# Patient Record
Sex: Female | Born: 1960 | Race: White | Hispanic: No | Marital: Married | Smoking: Current every day smoker
Health system: Southern US, Community
[De-identification: ages and names within clinical notes are randomized; demographics above are authoritative.]

## PROBLEM LIST (undated history)

## (undated) DIAGNOSIS — I10 Essential (primary) hypertension: Secondary | ICD-10-CM

## (undated) HISTORY — PX: MASTECTOMY: SHX3

---

## 2006-04-03 ENCOUNTER — Other Ambulatory Visit: Admission: RE | Admit: 2006-04-03 | Discharge: 2006-04-03 | Payer: Self-pay | Admitting: Family Medicine

## 2006-04-16 ENCOUNTER — Ambulatory Visit: Payer: Self-pay

## 2006-04-18 ENCOUNTER — Emergency Department: Payer: Self-pay | Admitting: Internal Medicine

## 2006-05-12 ENCOUNTER — Ambulatory Visit: Payer: Self-pay

## 2006-10-28 ENCOUNTER — Ambulatory Visit: Payer: Self-pay | Admitting: General Surgery

## 2007-09-15 ENCOUNTER — Ambulatory Visit (HOSPITAL_COMMUNITY): Admission: RE | Admit: 2007-09-15 | Discharge: 2007-09-15 | Payer: Self-pay | Admitting: Family Medicine

## 2007-11-19 ENCOUNTER — Ambulatory Visit (HOSPITAL_COMMUNITY): Admission: RE | Admit: 2007-11-19 | Discharge: 2007-11-20 | Payer: Self-pay | Admitting: General Surgery

## 2007-11-19 ENCOUNTER — Encounter (INDEPENDENT_AMBULATORY_CARE_PROVIDER_SITE_OTHER): Payer: Self-pay | Admitting: General Surgery

## 2007-11-21 ENCOUNTER — Emergency Department (HOSPITAL_COMMUNITY): Admission: EM | Admit: 2007-11-21 | Discharge: 2007-11-21 | Payer: Self-pay | Admitting: Emergency Medicine

## 2008-05-17 ENCOUNTER — Encounter: Admission: RE | Admit: 2008-05-17 | Discharge: 2008-05-17 | Payer: Self-pay | Admitting: Obstetrics and Gynecology

## 2008-05-25 IMAGING — CR DG TOE 5TH LEFT
1 series · 3 of 3 positions shown · non-contrast
Comparison: none

REASON FOR EXAM: Pnjury---LWT
COMMENTS:

PROCEDURE:     DXR - DXR TOE 5TH DIGIT LEFT FOOT  - April 18, 2006 [DATE]
RESULT:     Transverse fracture is appreciated along the base of the shaft
of the proximal phalanx fifth toe.  The distal fracture fragment
demonstrates lateral angulation.

[Series 1: view not recorded · 0.17mm/px · 3 of 3 slices shown]
[im 1/3]
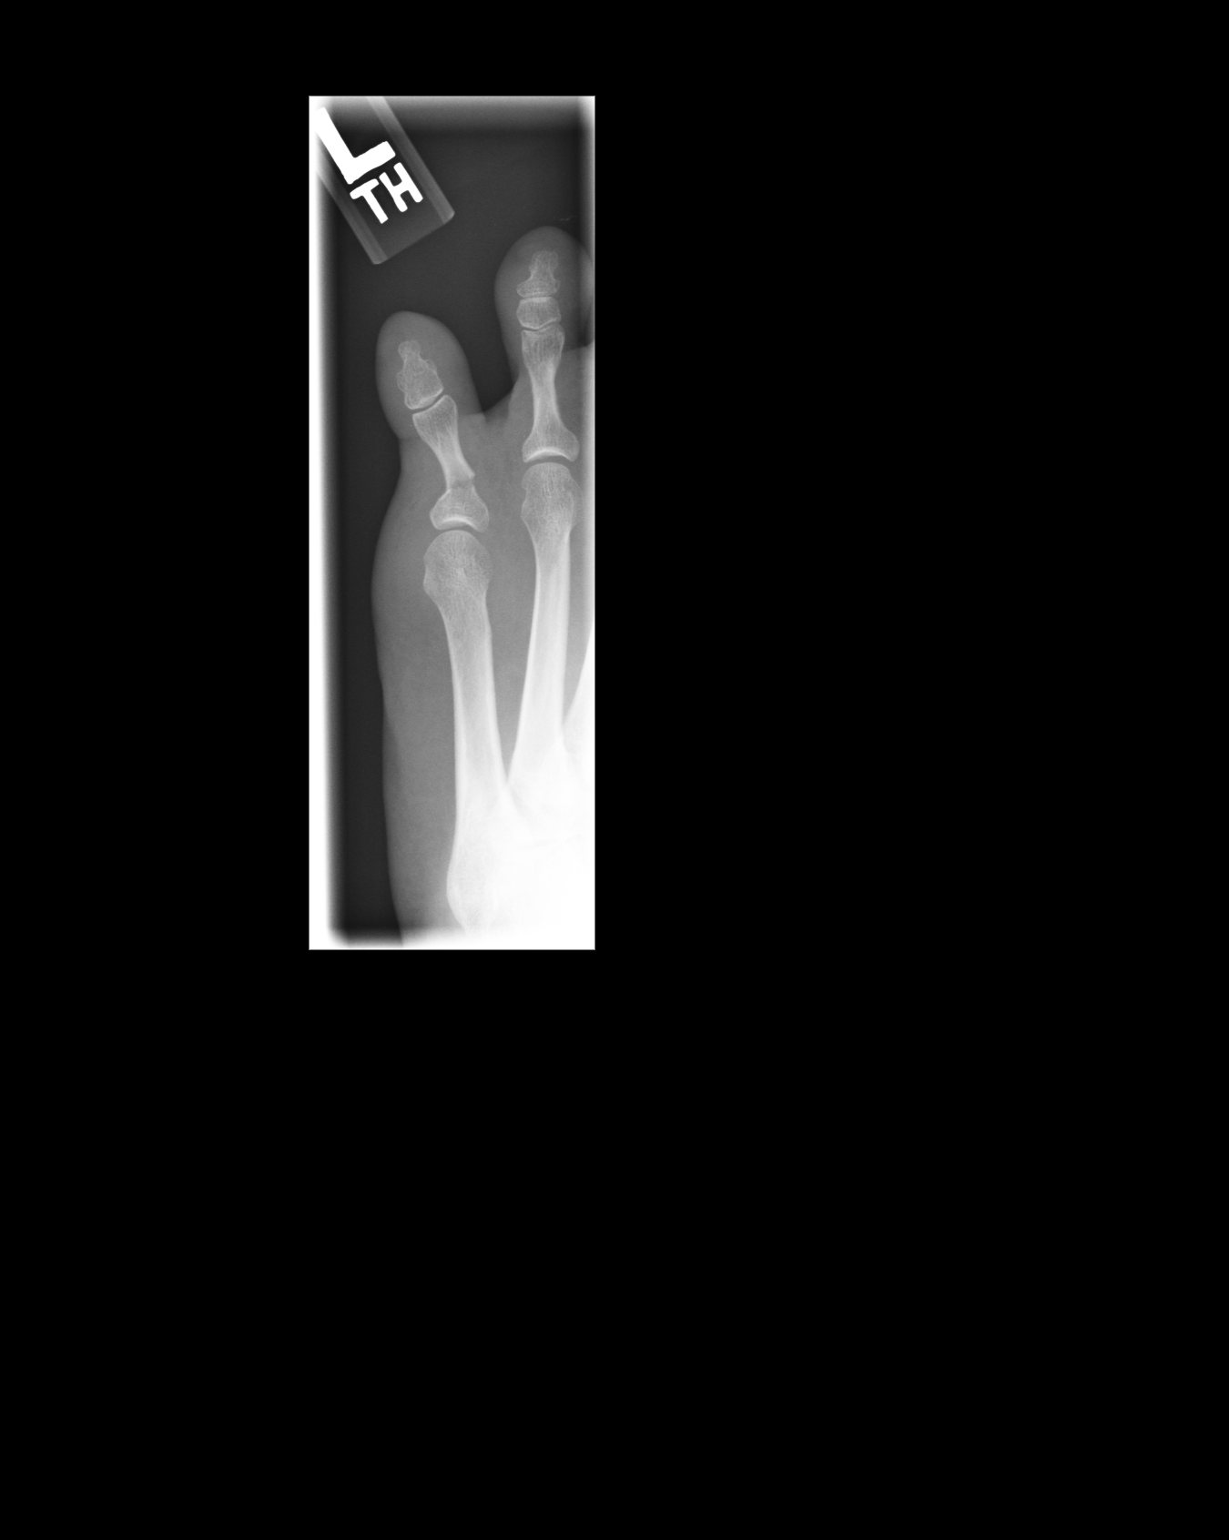
[im 2/3]
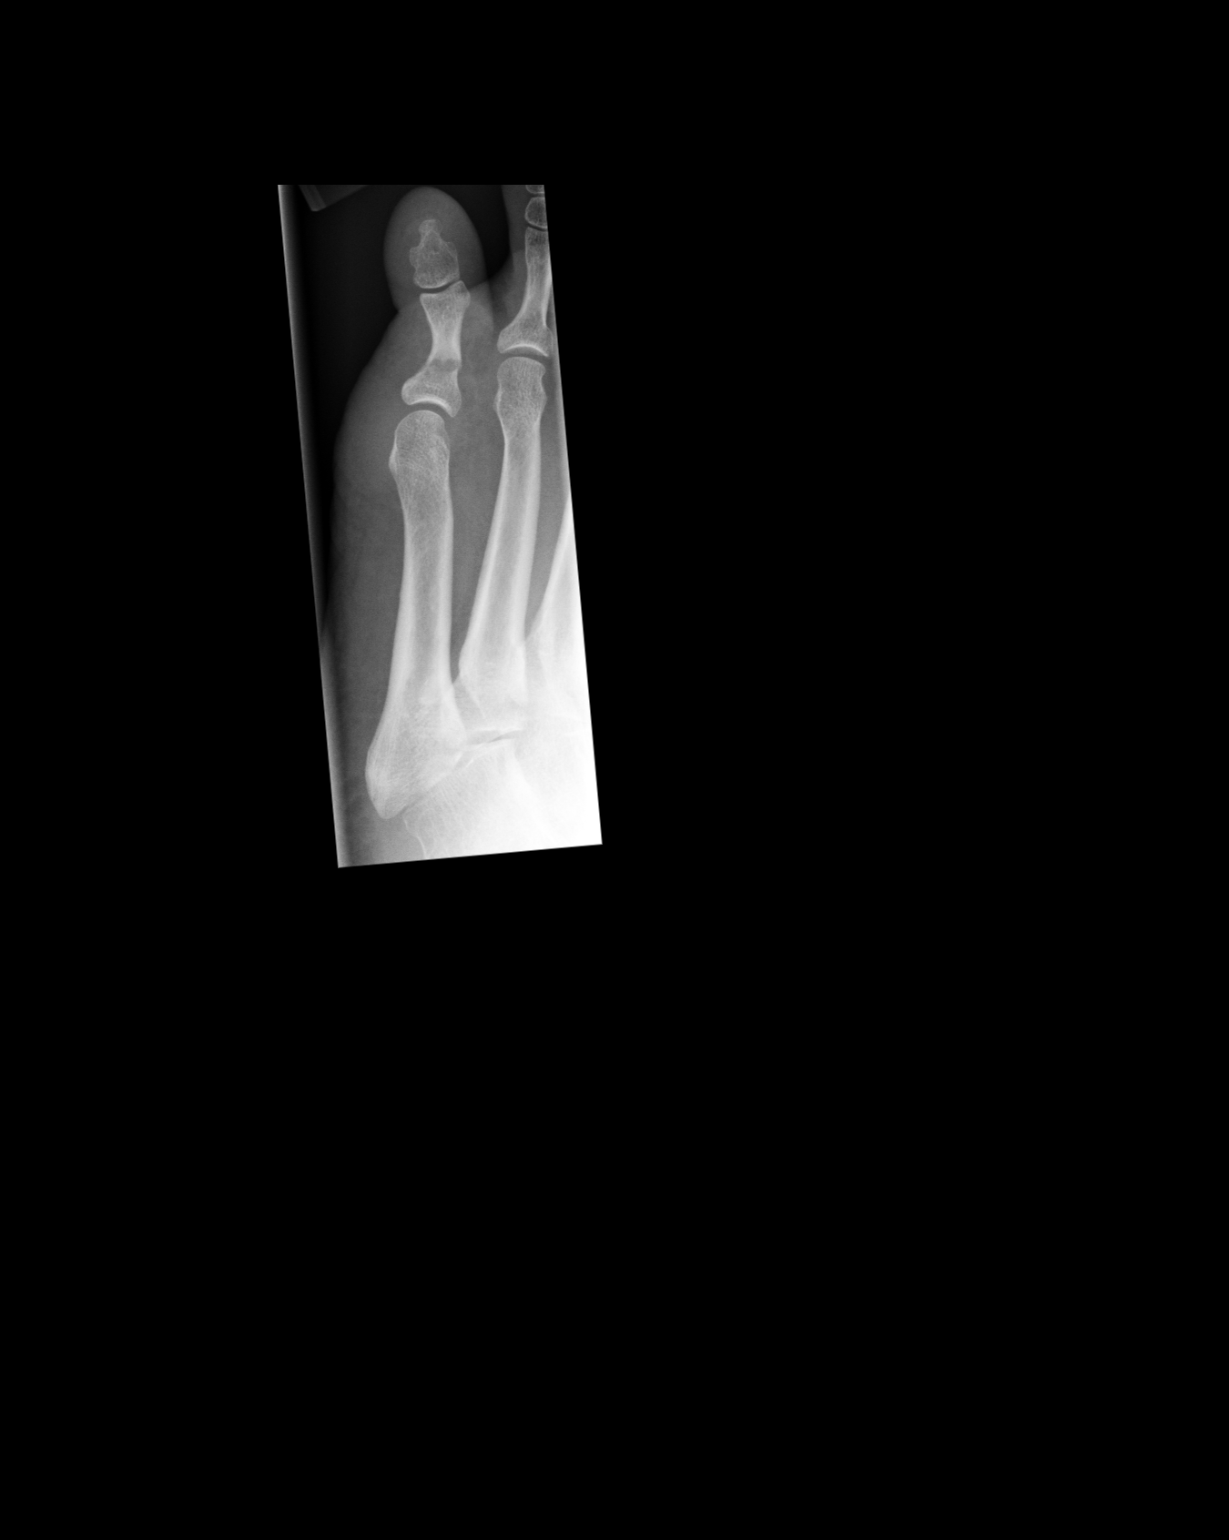
[im 3/3]
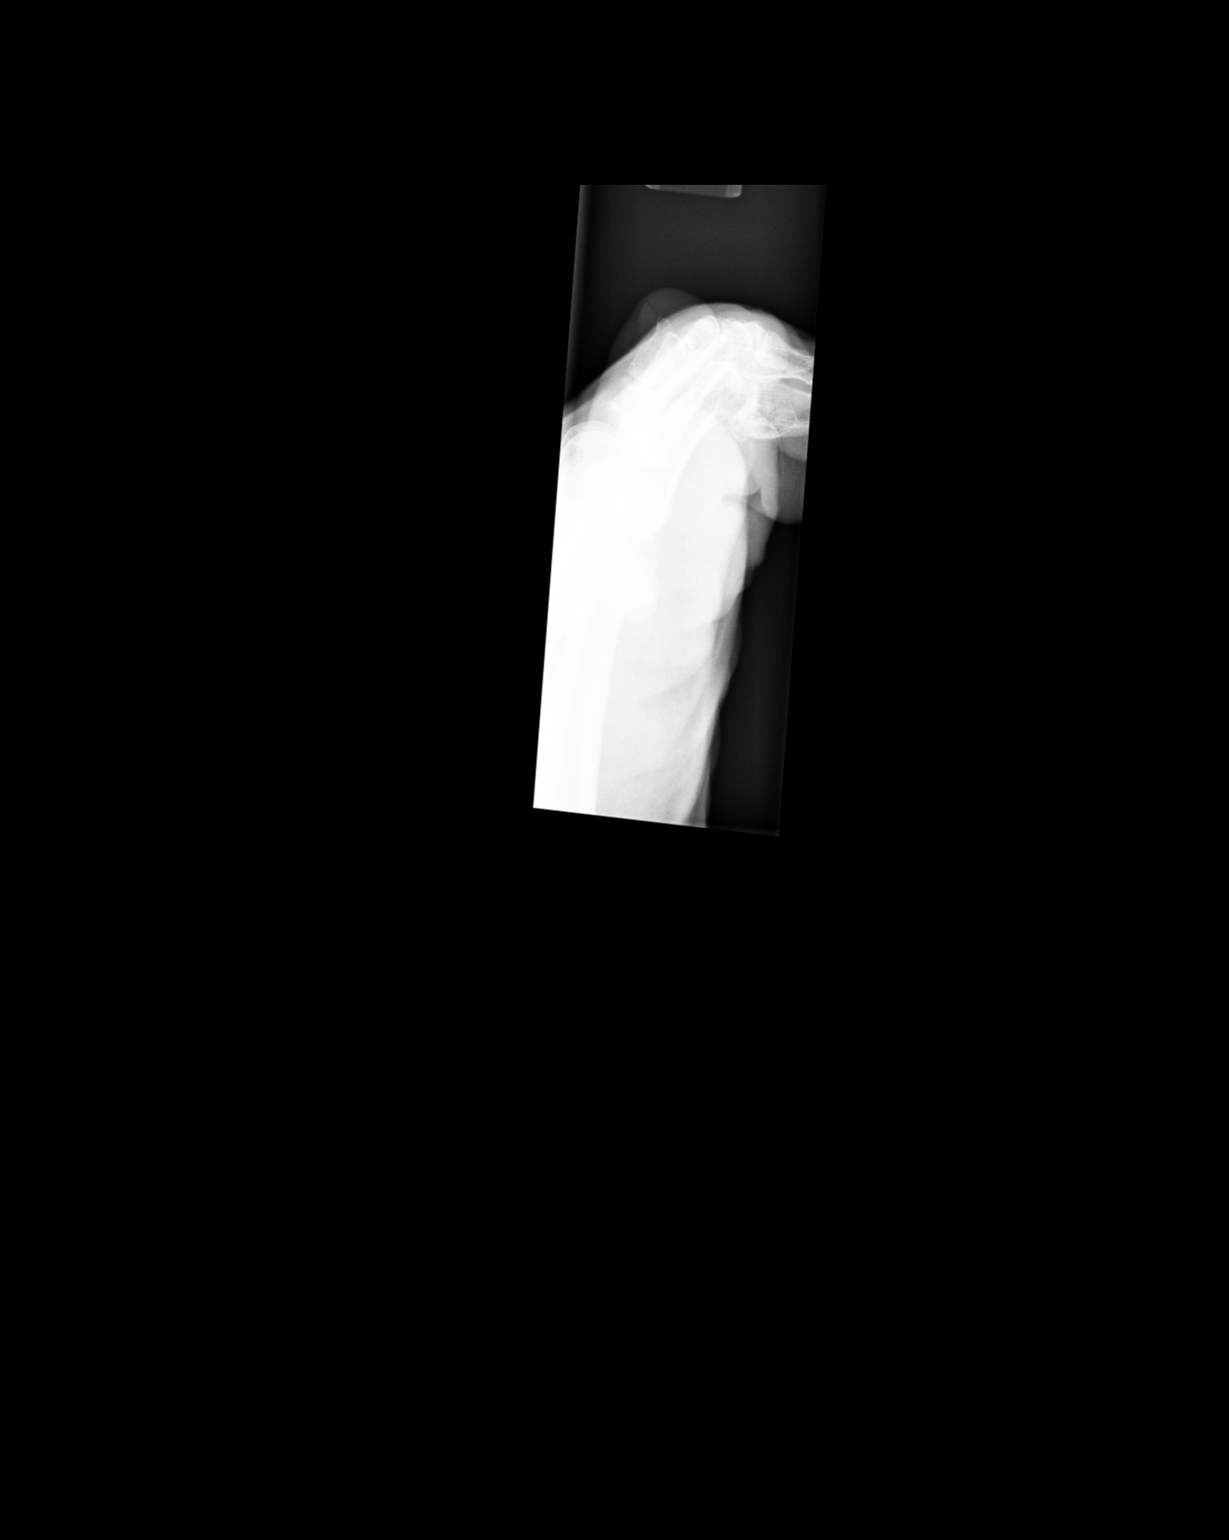

[3 of 3 positions shown; findings below may reference images not displayed]

IMPRESSION: Fifth toe fracture as described above.

## 2008-06-10 ENCOUNTER — Encounter: Admission: RE | Admit: 2008-06-10 | Discharge: 2008-06-10 | Payer: Self-pay | Admitting: General Surgery

## 2008-06-10 ENCOUNTER — Encounter (INDEPENDENT_AMBULATORY_CARE_PROVIDER_SITE_OTHER): Payer: Self-pay | Admitting: General Surgery

## 2008-06-10 ENCOUNTER — Ambulatory Visit (HOSPITAL_BASED_OUTPATIENT_CLINIC_OR_DEPARTMENT_OTHER): Admission: RE | Admit: 2008-06-10 | Discharge: 2008-06-10 | Payer: Self-pay | Admitting: General Surgery

## 2009-06-22 ENCOUNTER — Encounter: Admission: RE | Admit: 2009-06-22 | Discharge: 2009-06-22 | Payer: Self-pay | Admitting: Obstetrics and Gynecology

## 2010-05-26 ENCOUNTER — Other Ambulatory Visit: Payer: Self-pay | Admitting: Obstetrics and Gynecology

## 2010-05-26 DIAGNOSIS — Z9889 Other specified postprocedural states: Secondary | ICD-10-CM

## 2010-06-27 ENCOUNTER — Ambulatory Visit
Admission: RE | Admit: 2010-06-27 | Discharge: 2010-06-27 | Disposition: A | Discharge status: Home / Self Care | Payer: BC Managed Care – PPO | Source: Ambulatory Visit | Attending: Obstetrics and Gynecology | Admitting: Obstetrics and Gynecology

## 2010-06-27 DIAGNOSIS — Z9889 Other specified postprocedural states: Secondary | ICD-10-CM

## 2010-07-10 ENCOUNTER — Other Ambulatory Visit: Payer: Self-pay | Admitting: Obstetrics and Gynecology

## 2010-08-21 LAB — COMPREHENSIVE METABOLIC PANEL
ALT: 15 U/L (ref 0–35)
Albumin: 3.8 g/dL (ref 3.5–5.2)
Alkaline Phosphatase: 40 U/L (ref 39–117)
Calcium: 9.3 mg/dL (ref 8.4–10.5)
Glucose, Bld: 91 mg/dL (ref 70–99)
Potassium: 3.5 mEq/L (ref 3.5–5.1)
Sodium: 140 mEq/L (ref 135–145)
Total Protein: 6.5 g/dL (ref 6.0–8.3)

## 2010-08-21 LAB — DIFFERENTIAL
Lymphs Abs: 1.7 10*3/uL (ref 0.7–4.0)
Monocytes Absolute: 0.4 10*3/uL (ref 0.1–1.0)
Monocytes Relative: 8 % (ref 3–12)
Neutro Abs: 2.6 10*3/uL (ref 1.7–7.7)
Neutrophils Relative %: 54 % (ref 43–77)

## 2010-08-21 LAB — PTH, INTACT AND CALCIUM: Calcium, Total (PTH): 9.2 mg/dL (ref 8.4–10.5)

## 2010-08-21 LAB — CBC
Hemoglobin: 9.6 g/dL — ABNORMAL LOW (ref 12.0–15.0)
MCHC: 34.1 g/dL (ref 30.0–36.0)
Platelets: 255 10*3/uL (ref 150–400)
RDW: 13.7 % (ref 11.5–15.5)

## 2010-09-18 NOTE — Op Note (Signed)
[name], Kelly Archer                ACCOUNT NO.:  [id number]   MEDICAL RECORD NO.:  [id number]          PATIENT TYPE:  OIB   LOCATION:  5127                         FACILITY:  [facility]   PHYSICIAN:  Adolph Pollack, M.D.DATE OF BIRTH:  1961/04/02   DATE OF PROCEDURE:  11/19/2007  DATE OF DISCHARGE:                               OPERATIVE REPORT   PREOPERATIVE DIAGNOSIS:  Primary hyperparathyroidism.   POSTOPERATIVE DIAGNOSIS:  Primary hyperparathyroidism.   PROCEDURE:  Minimally invasive radioactive-guided parathyroidectomy  (right inferior parathyroid gland).   SURGEON:  Adolph Pollack, MD   ASSISTANT:  Velora Heckler, MD   ANESTHESIA:  General.   INDICATION:  This is a 50 year old female who has had elevated calcium  for many years.  An intact parathyroid hormone level was elevated at  113.7.  A sestamibi scan was performed and localized in area in the  right inferior thyroidal region consistent with parathyroid adenoma.  She now presents for [facility].   TECHNIQUE:  She was seen in the holding area and brought to the  operative room, placed supine on the operating table, and general  anesthetic was administered.  Her head was placed in slight extension  and the neck and upper chest were sterilely prepped and draped.  Using  the NeoProbe, I identified a site in the right inferior neck with  elevated counts.  I then made a limited right-sided transverse incision  beginning in the midline and extending toward the sternocleidomastoid  muscles through the skin and subcutaneous tissue.  The platysma was  divided with electrocautery.  A small supple platysma flaps were raised  superiorly and inferiorly.  I identified the strap muscles and a deep  cervical fascia in the midline and then divided this.  I then carefully  dissected the right strap muscles off the inferior aspect of the right  lobe of the thyroid gland.  I used the NeoProbe again to localize an  area of high counts.   Using careful blunt dissection, I then identified  an enlarged parathyroid gland.  I kept my dissection very close to the  parathyroid gland and retracted it anteriorly.  I identified the lateral  and medial blood supply.  I placed small clips on the lateral and medial  blood supply and then divided it freeing up the gland and then dissected  the gland from the surrounding areolar tissue using electrocautery.  I  kept my area of dissection anterior to where the recurrent laryngeal  nerve root be running.   The gland was then sent for frozen section analysis and was positive for  parathyroid tissue.   I inspected the wound, and hemostasis was adequate.  Surgicel was then  placed in the wound.  The strap muscles were re-approximated with  interrupted 3-0 Vicryl sutures.  Platysma muscles were re-approximated  with interrupted 3-0 Vicryl sutures.  The skin was closed with a running  4-0 Monocryl subcuticular stitch followed by Steri-Strips and a sterile  dressing.   She tolerated the procedure without any apparent complications.  Needle,  sponge, and instrument counts were reportedly to be  correct for closure.  She was taken to recovery room in satisfactory condition.       Adolph Pollack, M.D.  Electronically Signed     TJR/MEDQ  D:  11/19/2007  T:  11/20/2007  Job:  [id number]   cc:   Lianne Bushy, M.D.

## 2010-09-18 NOTE — Op Note (Signed)
Kelly Archer, Kelly Archer                ACCOUNT NO.:  [id number]   MEDICAL RECORD NO.:  [id number]          PATIENT TYPE:  AMB   LOCATION:  DSC                          FACILITY:  [facility]   PHYSICIAN:  Adolph Pollack, M.D.DATE OF BIRTH:  11/07/1960   DATE OF PROCEDURE:  06/10/2008  DATE OF DISCHARGE:                               OPERATIVE REPORT   PREOPERATIVE DIAGNOSIS:  Abnormal right mammogram.   POSTOPERATIVE DIAGNOSIS:  Abnormal right mammogram.   PROCEDURE:  Right partial mastectomy after needle localization.   SURGEON:  Adolph Pollack, MD   ANESTHESIA:  General plus Marcaine local.   INDICATIONS:  This 50 year old female was noted to have an increasing  density at the 6 o'clock position of the right breast.  She had had a  previous stereotactic-guided biopsy in 2008, which showed benign  disease.  However, the area continues to become larger and wider  excision after needle localization has been recommended and she presents  for this.   TECHNIQUE:  She underwent [facility]  She was seen in the holding area and the right  breast marked with my initials.  She was then brought to the operating  room, placed supine on the operating table, and given general anesthesia  by way of LMA.  The right breast bandage was removed, the wire was cut  close to the skin, and the right breast was sterilely prepped and  draped.  Just superior to the wire in the anterior aspect of breast, a  curvilinear incision was made through the skin and subcutaneous tissue.  The wire was identified and brought into the wound.  Using sharp  dissection and electrocautery, a mass of breast tissue was excised  around the wire.  This was then placed on the Faxitron and specimen  mammogram was performed.  I sent this over to the [facility], but was  not sure I could see the area of concern.  I subsequently went on two  separate occasions taking  some more tissue from the deeper margin and  from the lateral area as well.  Upon discussion with the radiologist, it  was felt that the area of concern was contained in the first specimen  and the other specimens could serve as margins.   Following this, I inspected the wound and controlled bleeding with  electrocautery.  Once hemostasis was adequate, I anesthetized the wound  superficially and deep with Marcaine solution.  I then closed the wound  in 2 layers reapproximating the subcutaneous breast tissue with  interrupted 3-0 Vicryl sutures and closing the skin with a running 4-0  Monocryl subcuticular stitch.  Steri-Strips and sterile dressing were  applied.   She tolerated the procedure well without any apparent complications and  was taken to the recovery room in satisfactory condition.      Adolph Pollack, M.D.  Electronically Signed     TJR/MEDQ  D:  06/10/2008  T:  06/11/2008  Job:  [id number]   cc:   [facility]  Lianne Bushy, M.D.

## 2011-01-31 LAB — COMPREHENSIVE METABOLIC PANEL
Alkaline Phosphatase: 51
BUN: 9
CO2: 28
Chloride: 104
Creatinine, Ser: 0.8
GFR calc non Af Amer: >60
Glucose, Bld: 96
Potassium: 4.2
Total Bilirubin: 1

## 2011-01-31 LAB — CBC
HCT: 40.5
Hemoglobin: 13.8
MCV: 87.7
WBC: 6.7

## 2011-01-31 LAB — DIFFERENTIAL
Basophils Absolute: 0.1
Basophils Relative: 1
Monocytes Absolute: 0.5
Neutro Abs: 3.3
Neutrophils Relative %: 49

## 2011-02-01 LAB — BASIC METABOLIC PANEL
CO2: 25
Calcium: 8.7
Creatinine, Ser: 0.88
GFR calc Af Amer: >60
GFR calc non Af Amer: >60

## 2011-02-01 LAB — CALCIUM
Calcium: 9.2
Calcium: 9.7

## 2014-01-28 ENCOUNTER — Other Ambulatory Visit: Payer: Self-pay | Admitting: Family Medicine

## 2014-01-28 DIAGNOSIS — Z1231 Encounter for screening mammogram for malignant neoplasm of breast: Secondary | ICD-10-CM

## 2014-02-10 ENCOUNTER — Ambulatory Visit
Admission: RE | Admit: 2014-02-10 | Discharge: 2014-02-10 | Disposition: A | Discharge status: Home / Self Care | Payer: BC Managed Care – PPO | Source: Ambulatory Visit | Attending: Family Medicine | Admitting: Family Medicine

## 2014-02-10 DIAGNOSIS — Z1231 Encounter for screening mammogram for malignant neoplasm of breast: Secondary | ICD-10-CM

## 2014-09-09 ENCOUNTER — Encounter: Payer: Self-pay | Admitting: *Deleted

## 2014-09-12 ENCOUNTER — Ambulatory Visit
Admission: RE | Admit: 2014-09-12 | Discharge: 2014-09-12 | Disposition: A | Discharge status: Home / Self Care | Payer: BLUE CROSS/BLUE SHIELD | Source: Ambulatory Visit | Attending: Gastroenterology | Admitting: Gastroenterology

## 2014-09-12 ENCOUNTER — Ambulatory Visit: Payer: BLUE CROSS/BLUE SHIELD | Admitting: Anesthesiology

## 2014-09-12 ENCOUNTER — Encounter: Payer: Self-pay | Admitting: *Deleted

## 2014-09-12 ENCOUNTER — Encounter
Admission: RE | Disposition: A | Discharge status: Home / Self Care | Payer: Self-pay | Source: Ambulatory Visit | Attending: Gastroenterology

## 2014-09-12 DIAGNOSIS — Z1211 Encounter for screening for malignant neoplasm of colon: Secondary | ICD-10-CM | POA: Diagnosis present

## 2014-09-12 DIAGNOSIS — I10 Essential (primary) hypertension: Secondary | ICD-10-CM | POA: Insufficient documentation

## 2014-09-12 DIAGNOSIS — K5730 Diverticulosis of large intestine without perforation or abscess without bleeding: Secondary | ICD-10-CM

## 2014-09-12 HISTORY — DX: Essential (primary) hypertension: I10

## 2014-09-12 HISTORY — PX: COLONOSCOPY: SHX5424

## 2014-09-12 LAB — POCT PREGNANCY, URINE: PREG TEST UR: NEGATIVE

## 2014-09-12 SURGERY — COLONOSCOPY
Anesthesia: General

## 2014-09-12 MED ORDER — PROPOFOL 10 MG/ML IV BOLUS
INTRAVENOUS | Status: DC | PRN
  Administered 2014-09-12: 40 mg via INTRAVENOUS

## 2014-09-12 MED ORDER — PROPOFOL INFUSION 10 MG/ML OPTIME
INTRAVENOUS | Status: DC | PRN
  Administered 2014-09-12: 140 ug/kg/min via INTRAVENOUS

## 2014-09-12 MED ORDER — SODIUM CHLORIDE 0.9 % IV SOLN
INTRAVENOUS | Status: DC
  Administered 2014-09-12: 09:00:00 via INTRAVENOUS
  Administered 2014-09-12: 1000 mL via INTRAVENOUS

## 2014-09-12 MED ORDER — LIDOCAINE HCL (CARDIAC) 20 MG/ML IV SOLN
INTRAVENOUS | Status: DC | PRN
  Administered 2014-09-12: 60 mg via INTRAVENOUS

## 2014-09-12 NOTE — H&P (Signed)
  Here for screening colonosocpy.  PMH; see above  Meds: see above  PE; NAD, VSS CV:RRR Lungs:CTA Abdomen: NABS, soft, nontender, -HSM   Imp: Screening   Plan: colonoscopy

## 2014-09-12 NOTE — Anesthesia Preprocedure Evaluation (Signed)
Anesthesia Evaluation  Patient identified by MRN, date of birth, ID band Patient awake    Reviewed: Allergy & Precautions, H&P , NPO status , Patient's Chart, lab work & pertinent test results, reviewed documented beta blocker date and time   Airway Mallampati: II  TM Distance: >3 FB Neck ROM: full    Dental no notable dental hx.    Pulmonary neg pulmonary ROS,  breath sounds clear to auscultation  Pulmonary exam normal       Cardiovascular Exercise Tolerance: Good hypertension, negative cardio ROS  Rhythm:regular Rate:Normal     Neuro/Psych negative neurological ROS  negative psych ROS   GI/Hepatic negative GI ROS, Neg liver ROS,   Endo/Other  negative endocrine ROS  Renal/GU negative Renal ROS  negative genitourinary   Musculoskeletal   Abdominal   Peds  Hematology negative hematology ROS (+)   Anesthesia Other Findings   Reproductive/Obstetrics negative OB ROS                             Anesthesia Physical Anesthesia Plan  ASA: II  Anesthesia Plan: General   Post-op Pain Management:    Induction:   Airway Management Planned:   Additional Equipment:   Intra-op Plan:   Post-operative Plan:   Informed Consent: I have reviewed the patients History and Physical, chart, labs and discussed the procedure including the risks, benefits and alternatives for the proposed anesthesia with the patient or authorized representative who has indicated his/her understanding and acceptance.   Dental Advisory Given  Plan Discussed with: CRNA  Anesthesia Plan Comments:         Anesthesia Quick Evaluation  

## 2014-09-12 NOTE — Transfer of Care (Signed)
Immediate Anesthesia Transfer of Care Note  Patient: Kelly Archer  Procedure(s) Performed: Procedure(s): COLONOSCOPY (N/A)  Patient Location: Endoscopy Unit  Anesthesia Type:General  Level of Consciousness: awake, alert , oriented and patient cooperative  Airway & Oxygen Therapy: Patient Spontanous Breathing and Patient connected to nasal cannula oxygen  Post-op Assessment: Report given to RN, Post -op Vital signs reviewed and stable and Patient moving all extremities X 4  Post vital signs: Reviewed and stable  Last Vitals:  Filed Vitals:   09/12/14 0951  BP: 107/78  Pulse: 82  Temp: 35.6 C  Resp: 17    Complications: No apparent anesthesia complications

## 2014-09-12 NOTE — Op Note (Signed)
[facility] Patient Name: Kelly DaughtersSandra Archer Procedure Date: 09/12/2014 9:02 AM MRN: [id number] Account #: [id number] Date of Birth: 04/20/1961 Admit Type: Outpatient Age: [age] [facility] Gender: Female Note Status: Finalized Procedure:         Colonoscopy Indications:       Screening for colorectal malignant neoplasm Providers:         Ezzard StandingPaul Y. Bluford Kaufmannh, MD Referring MD:      Sallye LatNo Local Md, MD (Referring MD) Medicines:         Monitored Anesthesia Care Complications:     No immediate complications. Procedure:         Pre-Anesthesia Assessment:                    - Prior to the procedure, a History and Physical was                     performed, and patient medications, allergies and                     sensitivities were reviewed. The patient's tolerance of                     previous anesthesia was reviewed.                    - The risks and benefits of the procedure and the sedation                     options and risks were discussed with the patient. All                     questions were answered and informed consent was obtained.                    - After reviewing the risks and benefits, the patient was                     deemed in satisfactory condition to undergo the procedure.                    After obtaining informed consent, the colonoscope was                     passed under direct vision. Throughout the procedure, the                     patient's blood pressure, pulse, and oxygen saturations                     were monitored continuously. The Colonoscope was                     introduced through the anus and advanced to the the cecum,                     identified by appendiceal orifice and ileocecal valve. The                     colonoscopy was performed without difficulty. The patient                     tolerated the procedure well. The quality of the bowel  preparation was fair. Findings:      A few  small-mouthed diverticula were found in the sigmoid colon.      The exam was otherwise without abnormality. Impression:        - Diverticulosis in the sigmoid colon.                    - The examination was otherwise normal.                    - No specimens collected. Recommendation:    - Discharge patient to home.                    - High fiber diet.                    - Repeat colonoscopy in 10 years for surveillance. Procedure Code(s): --- Professional ---                    [id number], Colonoscopy, flexible; diagnostic, including                     collection of specimen(s) by brushing or washing, when                     performed (separate procedure) Diagnosis Code(s): --- Professional ---                    Z12.11, Encounter for screening for malignant neoplasm of                     colon                    K57.30, Diverticulosis of large intestine without                     perforation or abscess without bleeding CPT copyright 2014 [employer]. All rights reserved. The codes documented in this report are preliminary and upon coder review may  be revised to meet current compliance requirements. Wallace CullensPaul Y Daesia Zylka, MD 09/12/2014 9:44:59 AM This report has been signed electronically. Number of Addenda: 0 Note Initiated On: 09/12/2014 9:02 AM Scope Withdrawal Time: 0 hours 10 minutes 14 seconds  Total Procedure Duration: 0 hours 14 minutes 39 seconds       [facility]

## 2014-09-12 NOTE — Anesthesia Postprocedure Evaluation (Signed)
  Anesthesia Post-op Note  Patient: Kelly Archer  Procedure(s) Performed: Procedure(s): COLONOSCOPY (N/A)  Anesthesia type:General  Patient location: PACU  Post pain: Pain level controlled  Post assessment: Post-op Vital signs reviewed, Patient's Cardiovascular Status Stable, Respiratory Function Stable, Patent Airway and No signs of Nausea or vomiting  Post vital signs: Reviewed and stable  Last Vitals:  Filed Vitals:   09/12/14 0951  BP: 107/78  Pulse: 82  Temp: 35.6 C  Resp: 17    Level of consciousness: awake, alert  and patient cooperative  Complications: No apparent anesthesia complications

## 2014-09-14 ENCOUNTER — Encounter: Payer: Self-pay | Admitting: Gastroenterology

## 2015-02-01 ENCOUNTER — Other Ambulatory Visit: Payer: Self-pay | Admitting: Family Medicine

## 2015-02-01 DIAGNOSIS — E2839 Other primary ovarian failure: Secondary | ICD-10-CM

## 2015-02-01 DIAGNOSIS — Z1231 Encounter for screening mammogram for malignant neoplasm of breast: Secondary | ICD-10-CM

## 2015-03-09 ENCOUNTER — Ambulatory Visit
Admission: RE | Admit: 2015-03-09 | Discharge: 2015-03-09 | Disposition: A | Discharge status: Home / Self Care | Payer: BLUE CROSS/BLUE SHIELD | Source: Ambulatory Visit | Attending: Family Medicine | Admitting: Family Medicine

## 2015-03-09 DIAGNOSIS — E2839 Other primary ovarian failure: Secondary | ICD-10-CM

## 2015-03-09 DIAGNOSIS — Z1231 Encounter for screening mammogram for malignant neoplasm of breast: Secondary | ICD-10-CM

## 2015-03-14 ENCOUNTER — Other Ambulatory Visit: Payer: Self-pay | Admitting: Family Medicine

## 2015-03-14 DIAGNOSIS — R928 Other abnormal and inconclusive findings on diagnostic imaging of breast: Secondary | ICD-10-CM

## 2015-03-21 ENCOUNTER — Ambulatory Visit
Admission: RE | Admit: 2015-03-21 | Discharge: 2015-03-21 | Disposition: A | Discharge status: Home / Self Care | Payer: BLUE CROSS/BLUE SHIELD | Source: Ambulatory Visit | Attending: Family Medicine | Admitting: Family Medicine

## 2015-03-21 DIAGNOSIS — R928 Other abnormal and inconclusive findings on diagnostic imaging of breast: Secondary | ICD-10-CM

## 2017-04-27 IMAGING — MG MM DIAGNOSTIC UNILATERAL L
2 series · 2 of 2 positions shown · non-contrast
Comparison: Previous exam(s).

CLINICAL DATA: Patient recalled from screening for left breast
calcifications.

EXAM:
DIGITAL DIAGNOSTIC LEFT MAMMOGRAM

[L CC]
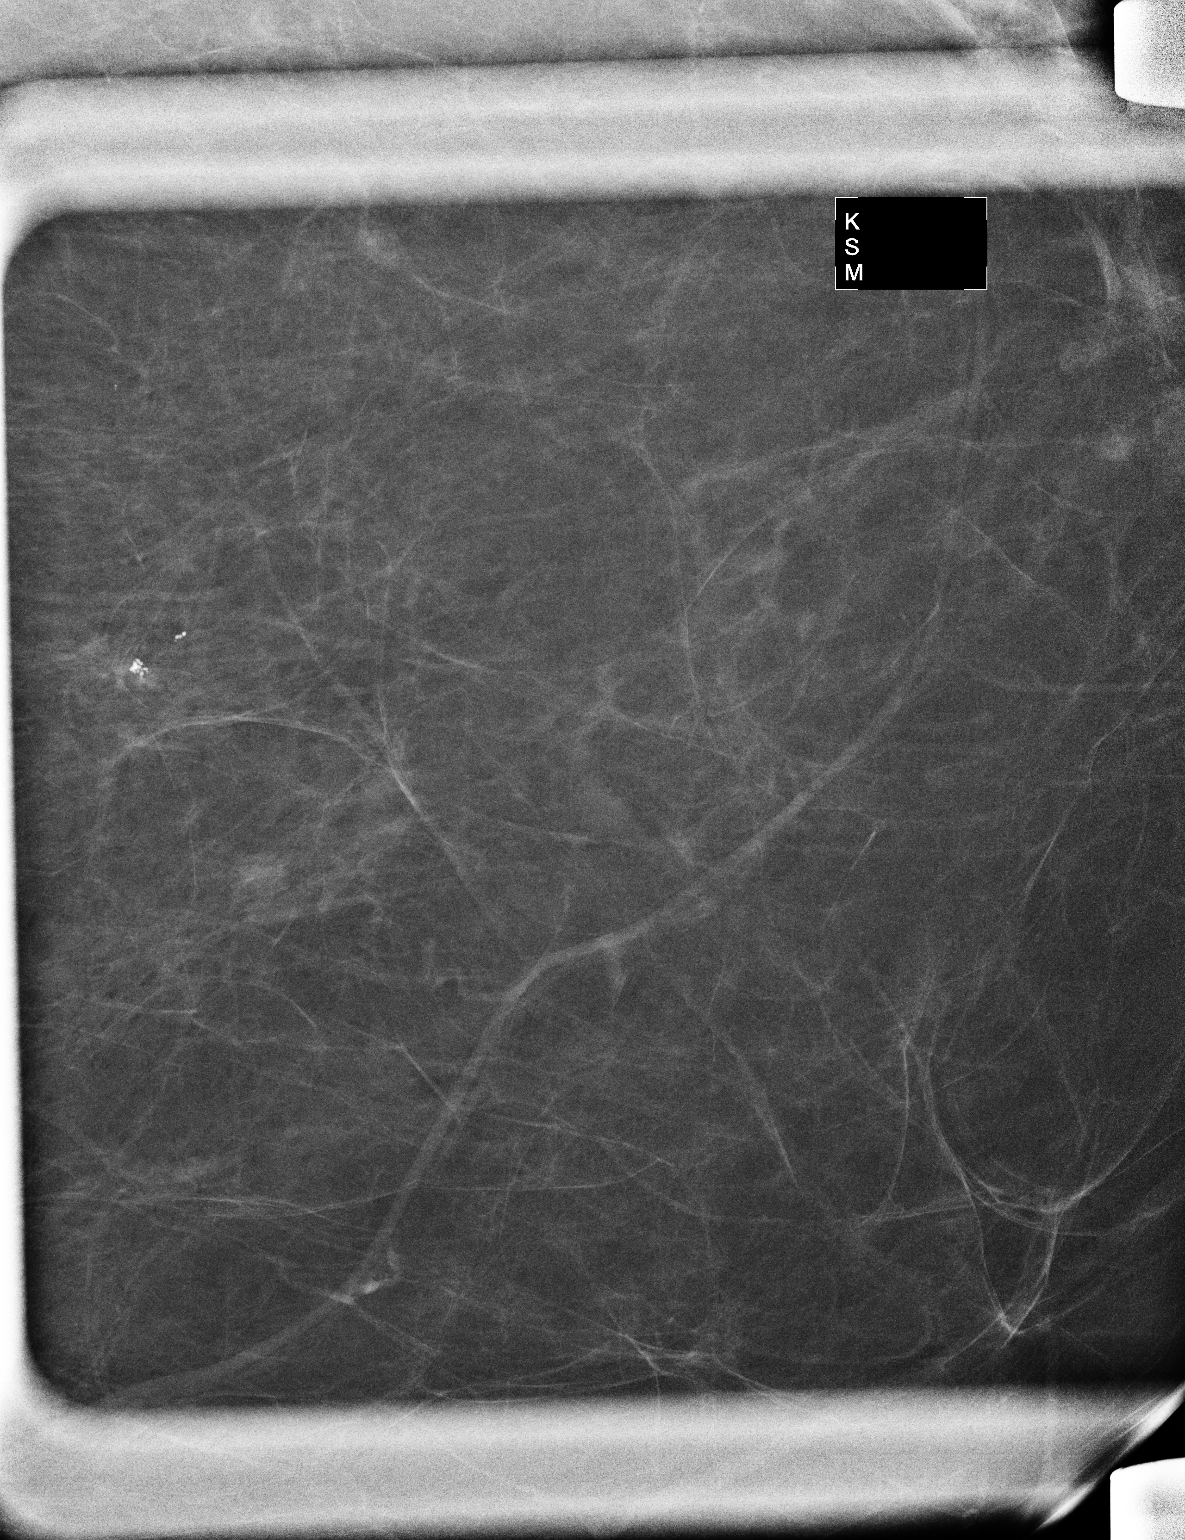

[L ML]
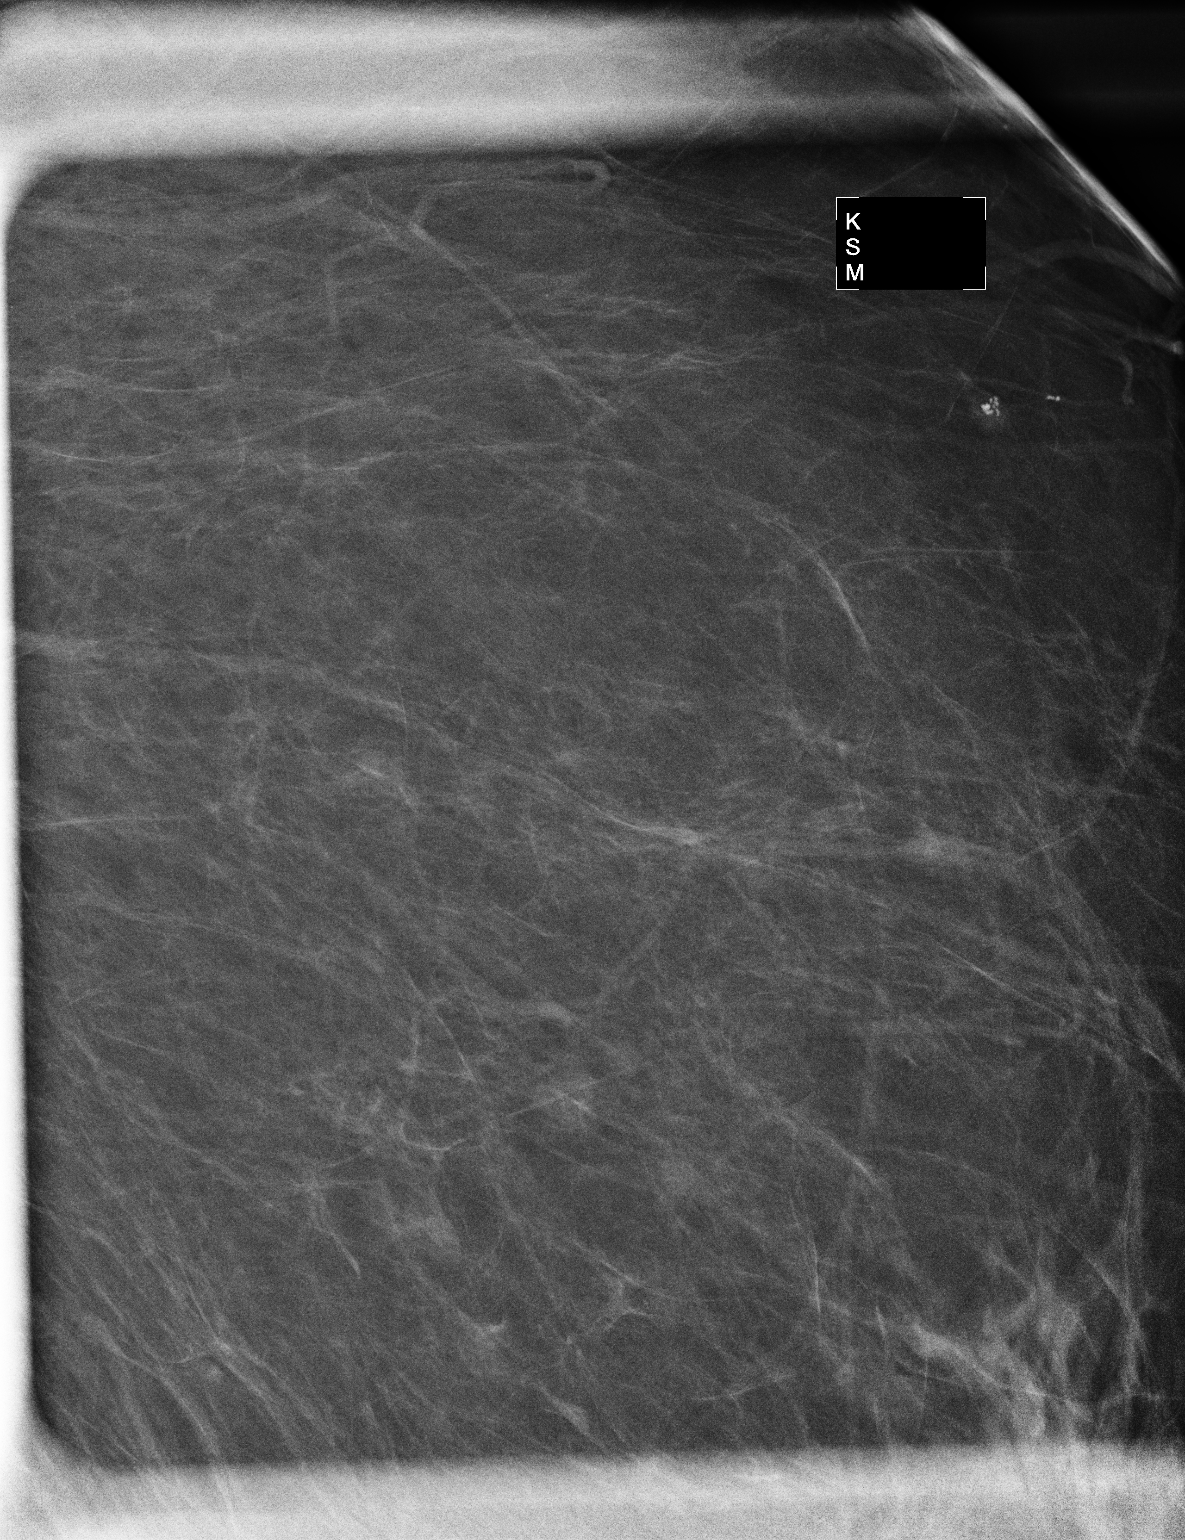

[2 of 2 positions shown; findings below may reference images not displayed]

ACR Breast Density Category b: There are scattered areas of
fibroglandular density.
FINDINGS: Magnification CC and true lateral views demonstrate a 7 mm group of
coarse calcifications within the upper inner left breast. These are
felt to be dystrophic in etiology.
IMPRESSION: Probable dystrophic calcifications left breast.

RECOMMENDATION:
Left breast diagnostic mammography with magnification views in 6
months.

I have discussed the findings and recommendations with the patient.
Results were also provided in writing at the conclusion of the
visit. If applicable, a reminder letter will be sent to the patient
regarding the next appointment.

BI-RADS CATEGORY  3: Probably benign.

## 2018-07-21 ENCOUNTER — Other Ambulatory Visit: Payer: Self-pay | Admitting: Nurse Practitioner

## 2018-07-21 DIAGNOSIS — Z1231 Encounter for screening mammogram for malignant neoplasm of breast: Secondary | ICD-10-CM

## 2018-08-05 ENCOUNTER — Other Ambulatory Visit: Payer: Self-pay | Admitting: Nurse Practitioner

## 2018-08-05 DIAGNOSIS — R921 Mammographic calcification found on diagnostic imaging of breast: Secondary | ICD-10-CM
